# Patient Record
Sex: Female | Born: 2005 | Race: Black or African American | Hispanic: No | Marital: Single | Smoking: Never smoker
Health system: Southern US, Community
[De-identification: ages and names within clinical notes are randomized; demographics above are authoritative.]

## PROBLEM LIST (undated history)

## (undated) ENCOUNTER — Inpatient Hospital Stay (HOSPITAL_COMMUNITY): Payer: Self-pay

## (undated) DIAGNOSIS — H9192 Unspecified hearing loss, left ear: Secondary | ICD-10-CM

## (undated) DIAGNOSIS — F1290 Cannabis use, unspecified, uncomplicated: Secondary | ICD-10-CM

## (undated) DIAGNOSIS — T7840XA Allergy, unspecified, initial encounter: Secondary | ICD-10-CM

## (undated) DIAGNOSIS — T1491XA Suicide attempt, initial encounter: Secondary | ICD-10-CM

## (undated) DIAGNOSIS — B338 Other specified viral diseases: Secondary | ICD-10-CM

## (undated) DIAGNOSIS — F32A Depression, unspecified: Secondary | ICD-10-CM

## (undated) DIAGNOSIS — E049 Nontoxic goiter, unspecified: Secondary | ICD-10-CM

## (undated) DIAGNOSIS — T3110 Burns involving 10-19% of body surface with 0% to 9% third degree burns: Secondary | ICD-10-CM

## (undated) HISTORY — PX: SKIN GRAFT: SHX250

---
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
## 2023-01-08 IMAGING — CT CT HEAD W/O CM
4 of 5 series · 15 of 47 positions shown, 17 images · non-contrast
Comparison: October 15, 2018.

CLINICAL DATA: Altercation.

EXAM:
CT HEAD WITHOUT CONTRAST
CT CERVICAL SPINE WITHOUT CONTRAST
TECHNIQUE: Multidetector CT imaging of the head and cervical spine was
performed following the standard protocol without intravenous
contrast. Multiplanar CT image reconstructions of the cervical spine
were also generated.

[Series 3: head without · axial · non-contrast · 0.44mm/px · z∈[+1065,+1165]mm · 4 of 34 slices shown (1 of 2)]
[im 7/34  brain]
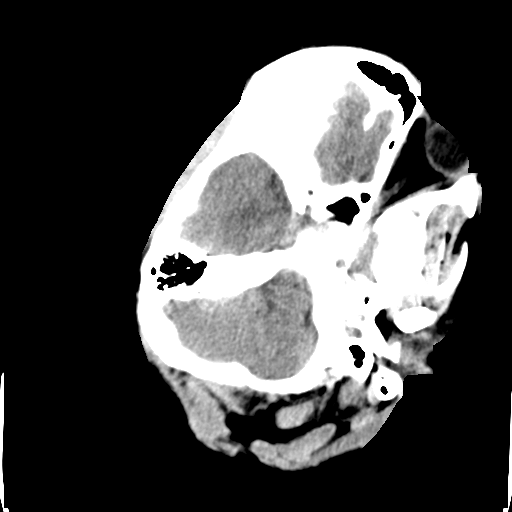
[im 14/34  brain]
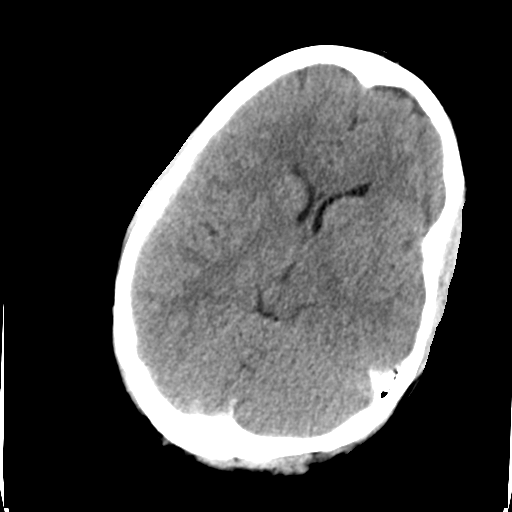
[im 20/34  brain]
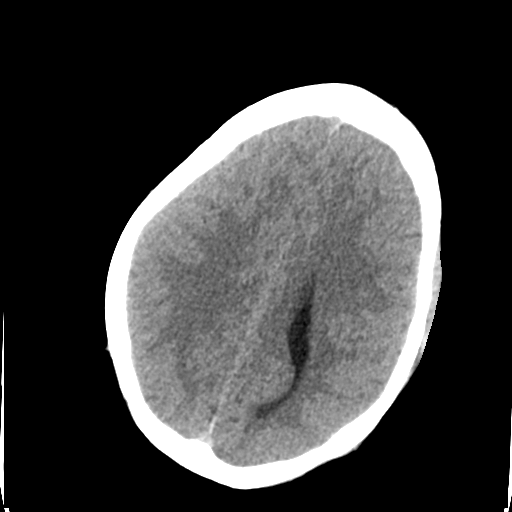
[im 27/34  brain]
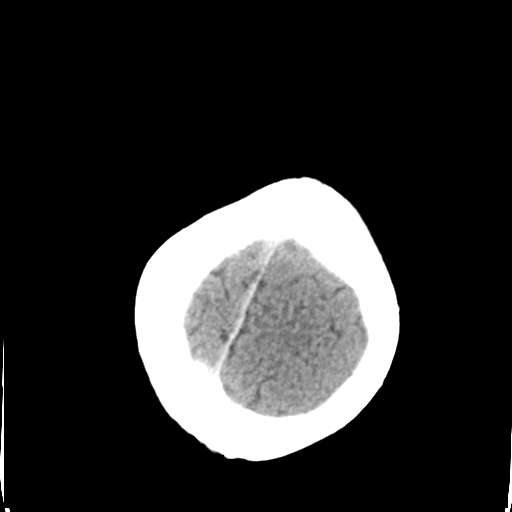

[Series 4: head without · axial · non-contrast · 0.44mm/px · z∈[+1060,+1170]mm · 5 of 34 slices shown, 7 images (2 of 2)]
[im 6/34  brain]
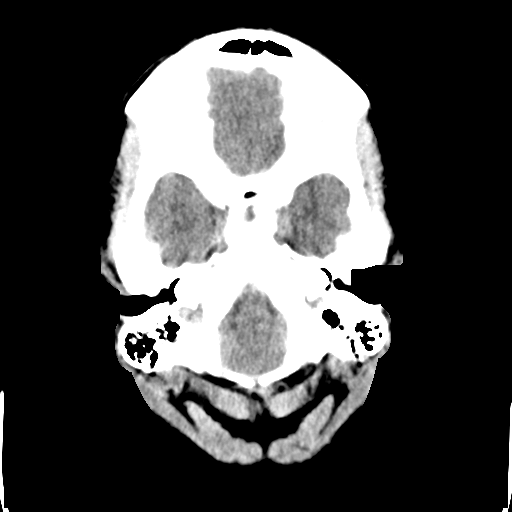
[im 6/34  bone]
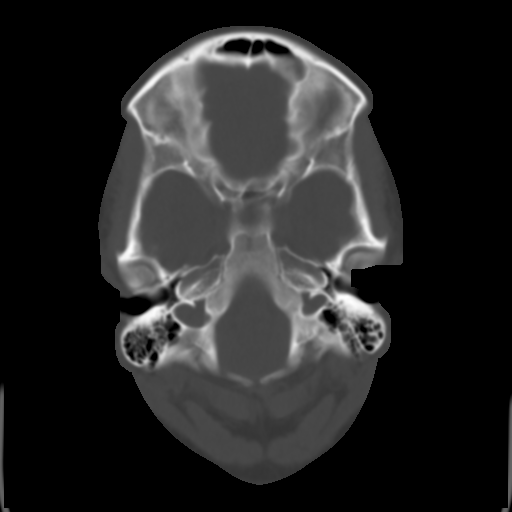
[im 12/34  brain]
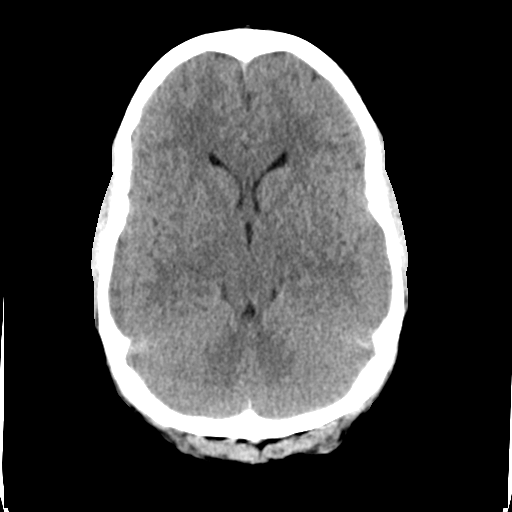
[im 17/34  brain]
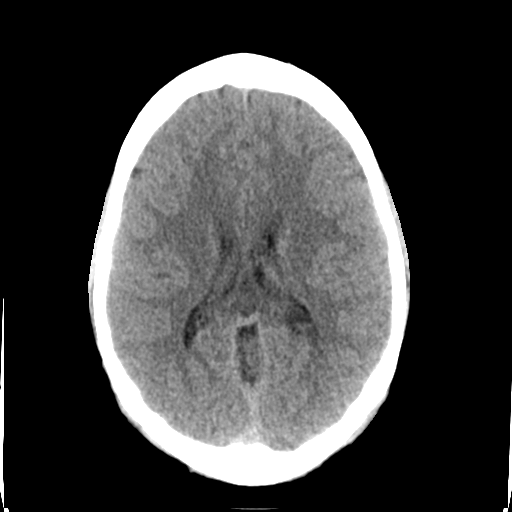
[im 23/34  brain]
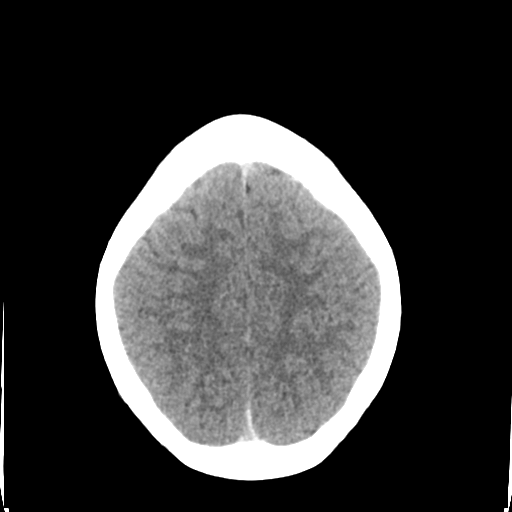
[im 28/34  brain]
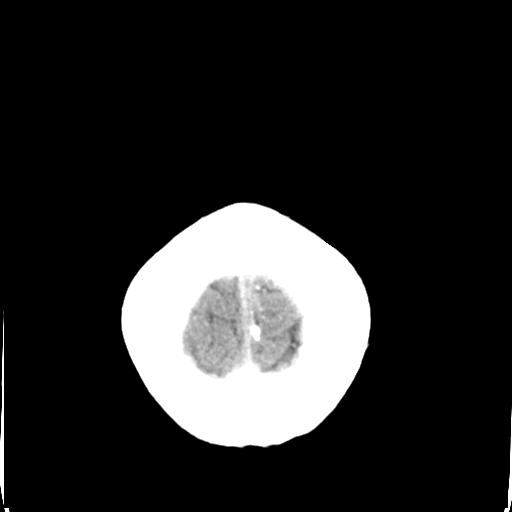
[im 28/34  bone]
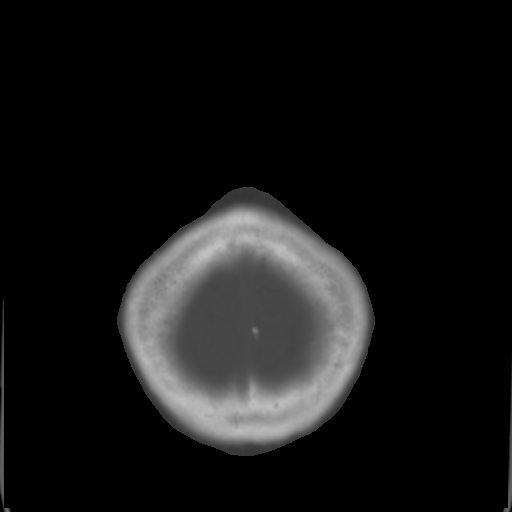

[Series 6: head without cor · coronal · non-contrast · 0.33mm/px · 3 of 69 slices shown]
[im 23/69  brain]
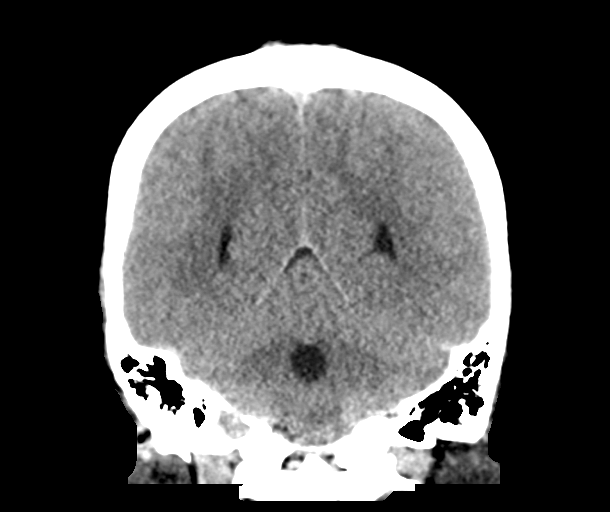
[im 31/69  brain]
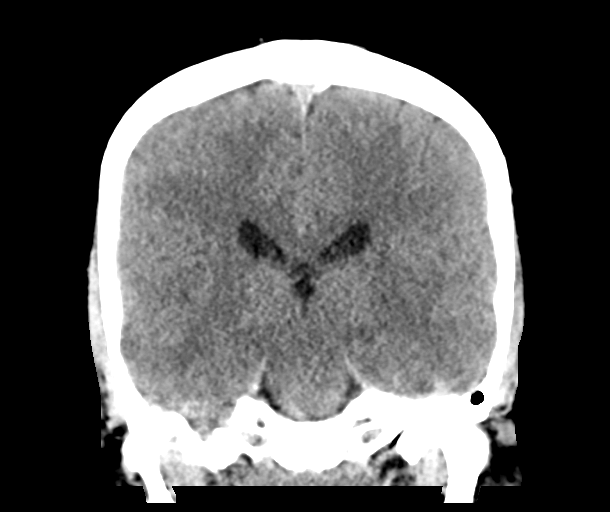
[im 38/69  brain]
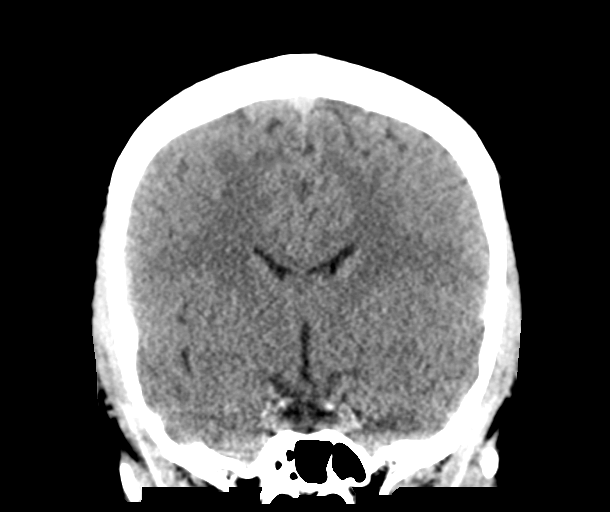

[Series 7: head without sag · sagittal · non-contrast · 0.32mm/px · 3 of 53 slices shown]
[im 18/53  brain]
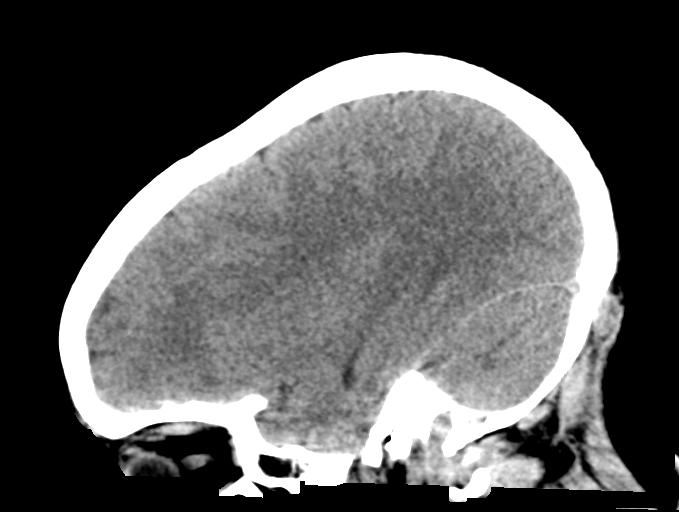
[im 27/53  brain]
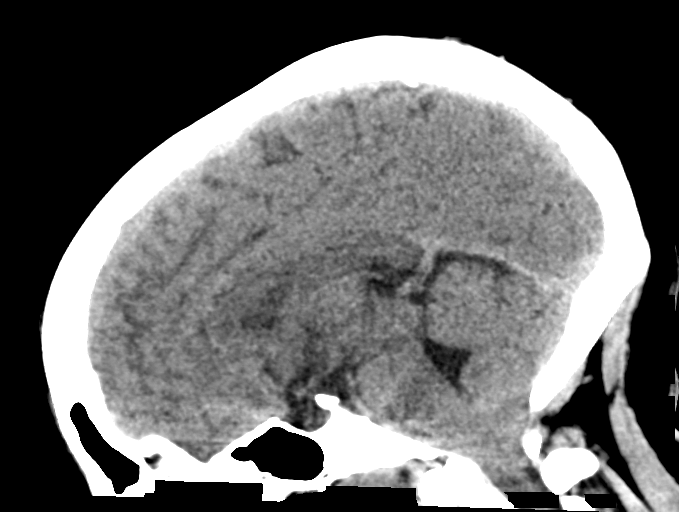
[im 35/53  brain]
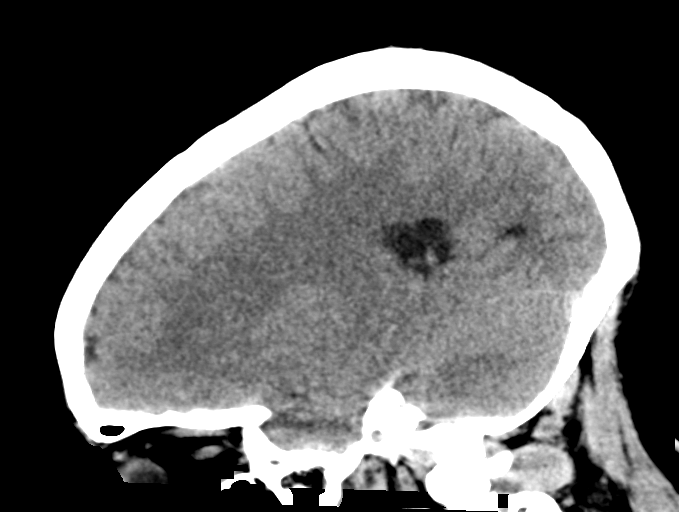

[15 of 47 positions shown; findings below may reference images not displayed]

FINDINGS: CT HEAD FINDINGS

Brain: No evidence of acute infarction, hemorrhage, hydrocephalus,
extra-axial collection or mass lesion/mass effect.

Vascular: No hyperdense vessel or unexpected calcification.

Skull: Normal. Negative for fracture or focal lesion.

Sinuses/Orbits: No acute finding.

Other: None.

CT CERVICAL SPINE FINDINGS

Alignment: Reversal of normal lordosis is noted which most likely is
positional in origin.

Skull base and vertebrae: No acute fracture. No primary bone lesion
or focal pathologic process.

Soft tissues and spinal canal: No prevertebral fluid or swelling. No
visible canal hematoma.

Disc levels:  Normal.

Upper chest: Negative.

Other: None.
IMPRESSION: Normal head CT.

Normal cervical spine.

## 2023-03-17 ENCOUNTER — Ambulatory Visit: Payer: Managed Care, Other (non HMO) | Admitting: Obstetrics & Gynecology

## 2023-03-17 ENCOUNTER — Encounter: Payer: Self-pay | Admitting: Obstetrics & Gynecology

## 2023-03-17 ENCOUNTER — Other Ambulatory Visit (HOSPITAL_COMMUNITY)
Admission: RE | Admit: 2023-03-17 | Discharge: 2023-03-17 | Disposition: A | Discharge status: Home / Self Care | Payer: Managed Care, Other (non HMO) | Source: Ambulatory Visit | Attending: Obstetrics & Gynecology | Admitting: Obstetrics & Gynecology

## 2023-03-17 VITALS — BP 123/70 | HR 72 | Ht 62.0 in | Wt 198.2 lb

## 2023-03-17 DIAGNOSIS — Z23 Encounter for immunization: Secondary | ICD-10-CM | POA: Diagnosis not present

## 2023-03-17 DIAGNOSIS — N760 Acute vaginitis: Secondary | ICD-10-CM | POA: Diagnosis not present

## 2023-03-17 DIAGNOSIS — B9689 Other specified bacterial agents as the cause of diseases classified elsewhere: Secondary | ICD-10-CM | POA: Diagnosis not present

## 2023-03-17 DIAGNOSIS — Z113 Encounter for screening for infections with a predominantly sexual mode of transmission: Secondary | ICD-10-CM

## 2023-03-17 DIAGNOSIS — Z3042 Encounter for surveillance of injectable contraceptive: Secondary | ICD-10-CM | POA: Diagnosis not present

## 2023-03-17 DIAGNOSIS — Z01419 Encounter for gynecological examination (general) (routine) without abnormal findings: Secondary | ICD-10-CM | POA: Insufficient documentation

## 2023-03-17 DIAGNOSIS — Z3202 Encounter for pregnancy test, result negative: Secondary | ICD-10-CM

## 2023-03-17 LAB — POCT URINE PREGNANCY: Preg Test, Ur: NEGATIVE

## 2023-03-17 MED ORDER — MEDROXYPROGESTERONE ACETATE 150 MG/ML IM SUSP
150.0000 mg | Freq: Once | INTRAMUSCULAR | Status: AC
  Administered 2023-03-17: 150 mg via INTRAMUSCULAR

## 2023-03-17 NOTE — Progress Notes (Signed)
 Pt. Presents for annual and birth control
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
## 2023-03-18 LAB — CERVICOVAGINAL ANCILLARY ONLY
Bacterial Vaginitis (gardnerella): POSITIVE — AB
Candida Glabrata: NEGATIVE
Candida Vaginitis: NEGATIVE
Chlamydia: NEGATIVE
Comment: NEGATIVE
Comment: NEGATIVE
Comment: NEGATIVE
Comment: NEGATIVE
Comment: NEGATIVE
Comment: NORMAL
Neisseria Gonorrhea: NEGATIVE
Trichomonas: NEGATIVE

## 2023-03-23 ENCOUNTER — Other Ambulatory Visit: Payer: Self-pay | Admitting: Obstetrics & Gynecology

## 2023-03-23 ENCOUNTER — Encounter: Payer: Self-pay | Admitting: Obstetrics & Gynecology

## 2023-03-23 MED ORDER — METRONIDAZOLE 500 MG PO TABS: 500.0000 mg | ORAL_TABLET | Freq: Two times a day (BID) | ORAL | 1 refills | Status: DC

## 2023-03-23 NOTE — Progress Notes (Signed)
 Flagyl prescribed to treat BV Message sent to patient.

## 2023-05-05 ENCOUNTER — Encounter (INDEPENDENT_AMBULATORY_CARE_PROVIDER_SITE_OTHER): Payer: Self-pay

## 2023-05-18 ENCOUNTER — Encounter (INDEPENDENT_AMBULATORY_CARE_PROVIDER_SITE_OTHER): Payer: Self-pay

## 2023-06-25 ENCOUNTER — Ambulatory Visit: Admitting: Advanced Practice Midwife

## 2023-06-25 NOTE — Progress Notes (Deleted)
   GYNECOLOGY PROGRESS NOTE  History:  18 y.o. No obstetric history on file. presents to [facility] *** office today for problem gyn visit. She reports *****.  She denies h/a, dizziness, shortness of breath, n/v, or fever/chills.    The following portions of the patient's history were reviewed and updated as appropriate: allergies, current medications, past family history, past medical history, past social history, past surgical history and problem list. Last pap smear on *** was normal, *** HRHPV.  Health Maintenance Due  Topic Date Due   Meningococcal B Vaccine (1 of 2 - Standard) Never done   Hepatitis C Screening  Never done   COVID-19 Vaccine (1 - 2024-25 season) Never done   DTaP/Tdap/Td (1 - Tdap) Never done   HPV VACCINES (2 - 3-dose series) 04/14/2023     Review of Systems:  Pertinent items are noted in HPI.   Objective:  Physical Exam There were no vitals taken for this visit. VS reviewed, nursing note reviewed,  Constitutional: well developed, well nourished, no distress HEENT: normocephalic CV: normal rate Pulm/chest wall: normal effort Breast Exam: deferred Abdomen: soft Neuro: alert and oriented x 3 Skin: warm, dry Psych: affect normal Pelvic exam: Cervix pink, visually closed, without lesion, scant white creamy discharge, vaginal walls and external genitalia normal Bimanual exam: Cervix 0/long/high, firm, anterior, neg CMT, uterus nontender, nonenlarged, adnexa without tenderness, enlargement, or mass  Assessment & Plan:  1. Routine screening for STI (sexually transmitted infection) (Primary) ***   No follow-ups on file.   Arlester Bence, CNM 1:11 PM

## 2023-10-01 ENCOUNTER — Ambulatory Visit

## 2023-10-01 VITALS — BP 120/54 | HR 70

## 2023-10-01 DIAGNOSIS — Z3201 Encounter for pregnancy test, result positive: Secondary | ICD-10-CM

## 2023-10-01 DIAGNOSIS — Z3200 Encounter for pregnancy test, result unknown: Secondary | ICD-10-CM

## 2023-10-01 LAB — POCT PREGNANCY, URINE: Preg Test, Ur: POSITIVE — AB

## 2023-10-01 NOTE — Progress Notes (Signed)
 Possible Pregnancy  Here today for pregnancy confirmation. UPT in office today is positive. Pt reports first positive home UPT on 09/28/2023. Reviewed dating with patient:   LMP: 08/23/2023 approximately  EDD: 06/02/2023 5w 4d today  OB history reviewed. Reviewed medications and allergies with patient; list of medications safe to take during pregnancy given.  Recommended pt begin prenatal vitamin and schedule prenatal care. Pt denies any further questions or concerns at this time.   Cyndee JAYSON Molt, RN 10/01/2023  1:13 PM

## 2023-10-01 NOTE — Patient Instructions (Signed)

## 2023-10-05 ENCOUNTER — Other Ambulatory Visit

## 2023-10-12 ENCOUNTER — Encounter (HOSPITAL_COMMUNITY): Payer: Self-pay | Admitting: Family Medicine

## 2023-10-12 ENCOUNTER — Inpatient Hospital Stay (HOSPITAL_COMMUNITY)
Admission: AD | Admit: 2023-10-12 | Discharge: 2023-10-12 | Disposition: A | Discharge status: Home / Self Care | Attending: Family Medicine | Admitting: Family Medicine

## 2023-10-12 ENCOUNTER — Inpatient Hospital Stay (HOSPITAL_COMMUNITY)

## 2023-10-12 DIAGNOSIS — N939 Abnormal uterine and vaginal bleeding, unspecified: Secondary | ICD-10-CM

## 2023-10-12 DIAGNOSIS — Z3A01 Less than 8 weeks gestation of pregnancy: Secondary | ICD-10-CM

## 2023-10-12 DIAGNOSIS — O034 Incomplete spontaneous abortion without complication: Secondary | ICD-10-CM | POA: Insufficient documentation

## 2023-10-12 LAB — TYPE AND SCREEN
ABO/RH(D): A POS
Antibody Screen: NEGATIVE

## 2023-10-12 LAB — CBC
HCT: 37.7 % (ref 36.0–46.0)
Hemoglobin: 12.9 g/dL (ref 12.0–15.0)
MCH: 31.5 pg (ref 26.0–34.0)
MCHC: 34.2 g/dL (ref 30.0–36.0)
MCV: 92 fL (ref 80.0–100.0)
Platelets: 311 K/uL (ref 150–400)
RBC: 4.1 MIL/uL (ref 3.87–5.11)
RDW: 13.1 % (ref 11.5–15.5)
WBC: 6.8 K/uL (ref 4.0–10.5)
nRBC: 0 % (ref 0.0–0.2)

## 2023-10-12 MED ORDER — ACETAMINOPHEN 500 MG PO TABS
1000.0000 mg | ORAL_TABLET | Freq: Once | ORAL | Status: AC
  Administered 2023-10-12: 1000 mg via ORAL
  Filled 2023-10-12: qty 2

## 2023-10-12 MED ORDER — MEDROXYPROGESTERONE ACETATE 150 MG/ML IM SUSP
150.0000 mg | Freq: Once | INTRAMUSCULAR | Status: AC
Start: 2023-10-12 — End: 2023-10-12
  Administered 2023-10-12: 150 mg via INTRAMUSCULAR
  Filled 2023-10-12: qty 1

## 2023-10-12 MED ORDER — ONDANSETRON HCL 4 MG/2ML IJ SOLN
4.0000 mg | Freq: Once | INTRAMUSCULAR | Status: AC
  Administered 2023-10-12: 4 mg via INTRAVENOUS
  Filled 2023-10-12: qty 2

## 2023-10-12 MED ORDER — LIDOCAINE HCL (PF) 1 % IJ SOLN
30.0000 mL | Freq: Once | INTRAMUSCULAR | Status: AC
  Administered 2023-10-12: 30 mL
  Filled 2023-10-12: qty 30

## 2023-10-12 MED ORDER — DOXYCYCLINE HYCLATE 100 MG IV SOLR
200.0000 mg | INTRAVENOUS | Status: AC
  Administered 2023-10-12: 200 mg via INTRAVENOUS
  Filled 2023-10-12: qty 200

## 2023-10-12 MED ORDER — FENTANYL CITRATE (PF) 100 MCG/2ML IJ SOLN
100.0000 ug | Freq: Once | INTRAMUSCULAR | Status: AC
  Administered 2023-10-12: 100 ug via INTRAVENOUS
  Filled 2023-10-12: qty 2

## 2023-10-12 MED ORDER — LACTATED RINGERS IV SOLN: INTRAVENOUS | Status: DC
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
## 2023-10-14 LAB — SURGICAL PATHOLOGY

## 2023-10-21 ENCOUNTER — Telehealth: Payer: Self-pay | Admitting: *Deleted

## 2023-10-21 ENCOUNTER — Other Ambulatory Visit

## 2023-10-21 NOTE — Telephone Encounter (Signed)
 Notified by staff patient US  appointment for today no longer needed. Per chart view had dating / viability US  scheduled 10/05/23 but had miscarriage  and procedure for retained products 10/12/23. US  appointment cancelled and called patient and left message appointment cancelled. Rock Skip PEAK

## 2023-10-30 ENCOUNTER — Encounter: Admitting: Family Medicine

## 2023-11-05 ENCOUNTER — Telehealth

## 2023-11-11 ENCOUNTER — Encounter: Admitting: Family Medicine

## 2023-12-06 ENCOUNTER — Encounter (HOSPITAL_COMMUNITY): Payer: Self-pay | Admitting: *Deleted

## 2023-12-06 ENCOUNTER — Other Ambulatory Visit: Payer: Self-pay

## 2023-12-06 ENCOUNTER — Ambulatory Visit (HOSPITAL_COMMUNITY)
Admission: EM | Admit: 2023-12-06 | Discharge: 2023-12-06 | Disposition: A | Discharge status: Home / Self Care | Attending: Internal Medicine | Admitting: Internal Medicine

## 2023-12-06 DIAGNOSIS — L03213 Periorbital cellulitis: Secondary | ICD-10-CM

## 2023-12-06 MED ORDER — AMOXICILLIN-POT CLAVULANATE 875-125 MG PO TABS: 1.0000 | ORAL_TABLET | Freq: Two times a day (BID) | ORAL | 0 refills | Status: AC

## 2023-12-06 MED ORDER — ERYTHROMYCIN 5 MG/GM OP OINT: TOPICAL_OINTMENT | OPHTHALMIC | 0 refills | Status: AC

## 2023-12-06 NOTE — ED Triage Notes (Signed)
 PT reports she woke up today with the RT upper eye lid swollen. Pt does not wear contacts. Pt reports she feels something in the corner of eye.

## 2023-12-06 NOTE — Discharge Instructions (Addendum)
 Symptoms and physical exam findings are consistent with preseptal cellulitis of the right upper eyelid.  We will treat this with antibiotics by mouth and topical antibiotics.  We will treat with the following recommendations: Augmentin  875 mg twice daily for 7 days.  This is an antibiotic.  Take this with food.  Erythromycin ointment apply small amount into the eye twice daily for 7 days May use warm compresses 2-3 times daily to help with symptoms May alternate Tylenol  and ibuprofen  for pain Make sure to stay hydrated by drinking plenty of water especially while taking antibiotics. Return to urgent care or PCP if symptoms worsen or fail to resolve.
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
